# Patient Record
Sex: Female | Born: 2000
Health system: Southern US, Community
[De-identification: ages and names within clinical notes are randomized; demographics above are authoritative.]

## PROBLEM LIST (undated history)

## (undated) HISTORY — PX: WISDOM TOOTH EXTRACTION: SHX21

---
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
## 2020-01-10 ENCOUNTER — Ambulatory Visit
Admission: EM | Admit: 2020-01-10 | Discharge: 2020-01-10 | Disposition: A | Discharge status: Home / Self Care | Payer: 59 | Attending: Physician Assistant | Admitting: Physician Assistant

## 2020-01-10 DIAGNOSIS — R42 Dizziness and giddiness: Secondary | ICD-10-CM | POA: Diagnosis not present

## 2020-01-10 MED ORDER — SODIUM CHLORIDE 0.9 % IV BOLUS
1000.0000 mL | Freq: Once | INTRAVENOUS | Status: AC
  Administered 2020-01-10: 1000 mL via INTRAVENOUS

## 2020-01-10 MED ORDER — MECLIZINE HCL 25 MG PO TABS: 25.0000 mg | ORAL_TABLET | Freq: Three times a day (TID) | ORAL | 0 refills | Status: AC | PRN

## 2020-01-10 NOTE — ED Provider Notes (Signed)
[name]    CSN: [id number] Arrival date & time: 01/10/20  0932      History   Chief Complaint Chief Complaint  Patient presents with  . Dizziness    HPI Michelle Wright is a 19 y.o. female.   19 year old female comes in for sudden onset of dizziness this morning.  States was getting out of her truck to walk into school when she felt dizzy.  She describes it as a spinning sensation, worse with eye and head movement.  Denies head injury, loss of consciousness.  Denies URI symptoms such as cough, congestion, sore throat.  Denies fever, chills, body aches.  Denies shortness of breath, chest pain, palpitations.  Denies abdominal pain, nausea, vomiting.  Denies urinary symptoms such as frequency, dysuria, hematuria.  She recently returned from [place] via plane flight, denies one-sided leg swelling, oral birth control use.  States was in [place] for band, and sweated a lot more than normal. Has tried to hydrate with water. Denies being on an diet at this time. Breakfast consists of granola bar and water.   History of COVID 10/08/2019.     History reviewed. No pertinent past medical history.  There are no problems to display for this patient.   Past Surgical History:  Procedure Laterality Date  . WISDOM TOOTH EXTRACTION      OB History   No obstetric history on file.      Home Medications    Prior to Admission medications   Medication Sig Start Date End Date Taking? Authorizing Provider  meclizine (ANTIVERT) 25 MG tablet Take 1 tablet (25 mg total) by mouth 3 (three) times daily as needed for dizziness. 01/10/20   Ok Edwards, PA-C    Family History Family History  Problem Relation Age of Onset  . Diabetes Maternal Grandfather   . Heart disease Paternal Grandfather   . Stroke Paternal Grandfather     Social History Social History   Tobacco Use  . Smoking status: Never Smoker  . Smokeless tobacco: Never Used  Substance Use Topics  . Alcohol use: No   . Drug use: No     Allergies   Amoxicillin   Review of Systems Review of Systems  Reason unable to perform ROS: See HPI as above.     Physical Exam Triage Vital Signs ED Triage Vitals  Enc Vitals Group     BP 01/10/20 0946 127/87     Pulse Rate 01/10/20 0946 73     Resp 01/10/20 0946 16     Temp 01/10/20 0946 98.1 F (36.7 C)     Temp Source 01/10/20 0946 Oral     SpO2 01/10/20 0946 98 %     Weight --      Height --      Head Circumference --      Peak Flow --      Pain Score 01/10/20 0951 0     Pain Loc --      Pain Edu? --      Excl. in [place]? --    Orthostatic VS for the past 24 hrs:  BP- Lying Pulse- Lying BP- Sitting Pulse- Sitting BP- Standing at 0 minutes Pulse- Standing at 0 minutes  01/10/20 0956 129/86 76 128/87 91 120/87 100    Updated Vital Signs BP 127/87 (BP Location: Left Arm)   Pulse 73   Temp 98.1 F (36.7 C) (Oral)   Resp 16   LMP 12/20/2019   SpO2 98%  Physical Exam Constitutional:      General: She is not in acute distress.    Appearance: Normal appearance. She is not ill-appearing, toxic-appearing or diaphoretic.  HENT:     Head: Normocephalic and atraumatic.     Right Ear: Tympanic membrane, ear canal and external ear normal. Tympanic membrane is not erythematous or bulging.     Left Ear: Tympanic membrane, ear canal and external ear normal. Tympanic membrane is not erythematous or bulging.     Mouth/Throat:     Mouth: Mucous membranes are moist.     Pharynx: Oropharynx is clear. Uvula midline.  Eyes:     Extraocular Movements: Extraocular movements intact.     Right eye: No nystagmus.     Left eye: No nystagmus.     Conjunctiva/sclera: Conjunctivae normal.     Pupils: Pupils are equal, round, and reactive to light.  Cardiovascular:     Rate and Rhythm: Normal rate and regular rhythm.     Heart sounds: Normal heart sounds. No murmur. No friction rub. No gallop.   Pulmonary:     Effort: Pulmonary effort is normal. No accessory  muscle usage, prolonged expiration, respiratory distress or retractions.     Comments: Lungs clear to auscultation without adventitious lung sounds. Musculoskeletal:     Cervical back: Normal range of motion and neck supple. No pain with movement, spinous process tenderness or muscular tenderness.  Neurological:     General: No focal deficit present.     Mental Status: She is alert and oriented to person, place, and time.     GCS: GCS eye subscore is 4. GCS verbal subscore is 5. GCS motor subscore is 6.     Comments: Cranial nerves II-XII grossly intact. Strength 5/5 bilaterally for upper and lower extremity. Sensation intact. Normal coordination with normal finger to nose, heel to shin. Negative pronator drift, romberg. Gait intact. Able to ambulate on own without difficulty.       UC Treatments / Results  Labs (all labs ordered are listed, but only abnormal results are displayed) Labs Reviewed - No data to display  EKG   Radiology No results found.  Procedures Procedures (including critical care time)  Medications Ordered in UC Medications  sodium chloride 0.9 % bolus 1,000 mL (1,000 mLs Intravenous New Bag/Given 01/10/20 1027)    Initial Impression / Assessment and Plan / UC Course  I have reviewed the triage vital signs and the nursing notes.  Pertinent labs & imaging results that were available during my care of the patient were reviewed by me and considered in my medical decision making (see chart for details).    Patient symptomatic during orthostatics. Neurology exam grossly intact. Although without orthostatic hypotension, given symptomatic orthostatics, will provide IV fluids and reevaluate.   Patient with significantly improved dizziness after IV fluids, though still with intermittent dizziness.  At this time, will provide meclizine for other symptomatic treatment.  Discussed eating nutritious meals, avoiding only having carbs in the morning.  Discussed hydrating  throughout the day.  Patient to follow-up with PCP for further evaluation management if symptoms not improving.  Otherwise return precautions given.  Patient expresses understanding and agrees to plan.  With patient's permission, discussed current findings and treatment plan with parents, who agrees to plan.  Final Clinical Impressions(s) / UC Diagnoses   Final diagnoses:  Orthostatic dizziness   ED Prescriptions    Medication Sig Dispense Auth. Provider   meclizine (ANTIVERT) 25 MG tablet Take 1 tablet (25  mg total) by mouth 3 (three) times daily as needed for dizziness. 15 tablet Belinda Fisher, PA-C     PDMP not reviewed this encounter.   Belinda Fisher, PA-C 01/10/20 1120

## 2020-01-10 NOTE — ED Triage Notes (Signed)
Pt c/o dizziness after getting out of her truck to walk into school. States ate breakfast and still dizzy. [place]. Denies any URI sx's. States had COVID 11/20

## 2020-01-10 NOTE — Discharge Instructions (Signed)
As discussed, dizziness most likely from dehydration. However, given also with dizziness during head movement, and recently flew on plane, can try meclizine if needed to help with dizziness. Keep hydrated, urine should be clear to pale yellow in color. Make sure to eat healthy/nutritious food to prevent dizziness/dehydration. Follow up with PCP for further work up if continues with dizziness. Go to the emergency department if passing out, significantly worsening symptoms.

## 2020-03-15 ENCOUNTER — Encounter: Payer: Self-pay | Admitting: Emergency Medicine

## 2020-03-15 ENCOUNTER — Other Ambulatory Visit: Payer: Self-pay

## 2020-03-15 ENCOUNTER — Ambulatory Visit
Admission: EM | Admit: 2020-03-15 | Discharge: 2020-03-15 | Disposition: A | Discharge status: Home / Self Care | Payer: 59 | Attending: Physician Assistant | Admitting: Physician Assistant

## 2020-03-15 DIAGNOSIS — R112 Nausea with vomiting, unspecified: Secondary | ICD-10-CM | POA: Diagnosis not present

## 2020-03-15 MED ORDER — ONDANSETRON 4 MG PO TBDP: 4.0000 mg | ORAL_TABLET | Freq: Three times a day (TID) | ORAL | 0 refills | Status: AC | PRN

## 2020-03-15 MED ORDER — ONDANSETRON 4 MG PO TBDP
4.0000 mg | ORAL_TABLET | Freq: Once | ORAL | Status: AC
  Administered 2020-03-15: 09:00:00 4 mg via ORAL

## 2020-03-15 NOTE — ED Notes (Signed)
Reports dizziness with each position change during orthostatic VS check.

## 2020-03-15 NOTE — ED Provider Notes (Signed)
[name]    CSN: [id number] Arrival date & time: 03/15/20  7096      History   Chief Complaint Chief Complaint  Patient presents with  . Abdominal Pain  . Dizziness  . Nausea    HPI Michelle Wright is a 19 y.o. female.   19 year old female comes in for 2-day history of nausea, vomiting.  Has had 3 episodes of nonbilious nonbloody vomit.  Has low abdominal cramping/throbbing that is constant, not associated with oral intake, vomiting.  Denies diarrhea.  Has had dizziness/lightheadedness for the past 2 days as well.  Denies fever, chills, body aches.  Denies URI symptoms such as cough, congestion, sore throat.  Denies urinary symptoms frequency, dysuria, hematuria.  LMP 02/16/2020, denies possibility of pregnancy.     History reviewed. No pertinent past medical history.  There are no problems to display for this patient.   Past Surgical History:  Procedure Laterality Date  . WISDOM TOOTH EXTRACTION      OB History   No obstetric history on file.      Home Medications    Prior to Admission medications   Medication Sig Start Date End Date Taking? Authorizing Provider  meclizine (ANTIVERT) 25 MG tablet Take 1 tablet (25 mg total) by mouth 3 (three) times daily as needed for dizziness. 01/10/20   Tasia Catchings, Chisom Muntean V, PA-C  ondansetron (ZOFRAN ODT) 4 MG disintegrating tablet Take 1 tablet (4 mg total) by mouth every 8 (eight) hours as needed for nausea or vomiting. 03/15/20   Ok Edwards, PA-C    Family History Family History  Problem Relation Age of Onset  . Diabetes Maternal Grandfather   . Heart disease Paternal Grandfather   . Stroke Paternal Grandfather     Social History Social History   Tobacco Use  . Smoking status: Never Smoker  . Smokeless tobacco: Never Used  Substance Use Topics  . Alcohol use: No  . Drug use: No     Allergies   Amoxicillin   Review of Systems Review of Systems  Reason unable to perform ROS: See HPI as above.      Physical Exam Triage Vital Signs ED Triage Vitals  Enc Vitals Group     BP 03/15/20 0840 121/83     Pulse Rate 03/15/20 0840 92     Resp 03/15/20 0840 16     Temp 03/15/20 0840 98.3 F (36.8 C)     Temp Source 03/15/20 0840 Oral     SpO2 03/15/20 0840 96 %     Weight --      Height --      Head Circumference --      Peak Flow --      Pain Score 03/15/20 0838 2     Pain Loc --      Pain Edu? --      Excl. in [place]? --    Orthostatic VS for the past 24 hrs:  BP- Lying Pulse- Lying BP- Sitting Pulse- Sitting BP- Standing at 0 minutes Pulse- Standing at 0 minutes  03/15/20 0843 121/83 91 123/81 97 121/86 106    Updated Vital Signs BP 121/83   Pulse 92   Temp 98.3 F (36.8 C) (Oral)   Resp 16   LMP 02/16/2020   SpO2 96%   Physical Exam Constitutional:      General: She is not in acute distress.    Appearance: She is well-developed. She is not ill-appearing, toxic-appearing or diaphoretic.  HENT:     Head: Normocephalic and atraumatic.  Eyes:     Conjunctiva/sclera: Conjunctivae normal.     Pupils: Pupils are equal, round, and reactive to light.  Cardiovascular:     Rate and Rhythm: Normal rate and regular rhythm.  Pulmonary:     Effort: Pulmonary effort is normal. No respiratory distress.     Comments: LCTAB Abdominal:     General: Bowel sounds are normal.     Palpations: Abdomen is soft.     Tenderness: There is no abdominal tenderness. There is no right CVA tenderness, left CVA tenderness, guarding or rebound.  Musculoskeletal:     Cervical back: Normal range of motion and neck supple.  Skin:    General: Skin is warm and dry.  Neurological:     Mental Status: She is alert and oriented to person, place, and time.  Psychiatric:        Behavior: Behavior normal.        Judgment: Judgment normal.    UC Treatments / Results  Labs (all labs ordered are listed, but only abnormal results are displayed) Labs Reviewed - No data to  display  EKG   Radiology No results found.  Procedures Procedures (including critical care time)  Medications Ordered in UC Medications  ondansetron (ZOFRAN-ODT) disintegrating tablet 4 mg (4 mg Oral Given 03/15/20 0916)    Initial Impression / Assessment and Plan / UC Course  I have reviewed the triage vital signs and the nursing notes.  Pertinent labs & imaging results that were available during my care of the patient were reviewed by me and considered in my medical decision making (see chart for details).    No alarming signs on exam. Abdomen soft, + BS, nontender to palpation.  Negative orthostatics.  Patient with history of dizziness/lightheadedness due to dehydration, will have patient hydrate well at this time.  Symptomatic management discussed.  Return precautions given.  Patient expresses understanding and agrees to plan.  Final Clinical Impressions(s) / UC Diagnoses   Final diagnoses:  Intractable vomiting with nausea, unspecified vomiting type   ED Prescriptions    Medication Sig Dispense Auth. Provider   ondansetron (ZOFRAN ODT) 4 MG disintegrating tablet Take 1 tablet (4 mg total) by mouth every 8 (eight) hours as needed for nausea or vomiting. 20 tablet Belinda Fisher, PA-C     PDMP not reviewed this encounter.   Belinda Fisher, PA-C 03/15/20 [phone]

## 2020-03-15 NOTE — Discharge Instructions (Signed)
Zofran for nausea and vomiting as needed. Keep hydrated, you urine should be clear to pale yellow in color. Bland diet, advance as tolerated. Monitor for any worsening of symptoms, nausea or vomiting not controlled by medication, worsening abdominal pain, fever, go to the emergency department for further evaluation needed.  ° °

## 2020-03-15 NOTE — ED Triage Notes (Signed)
PT reports nausea for 2 days, she vomited 3 times last night. Dizziness / lightheadedness for 2 days as well, no LOC. Lower abdominal pain last night.

## 2021-01-23 IMAGING — DX DG CHEST 1V PORT
1 series · 1 of 1 positions shown · non-contrast
Comparison: None.

CLINICAL DATA: Shortness of breath, dizziness, dehydration.

EXAM:
PORTABLE CHEST 1 VIEW

[chest ap]
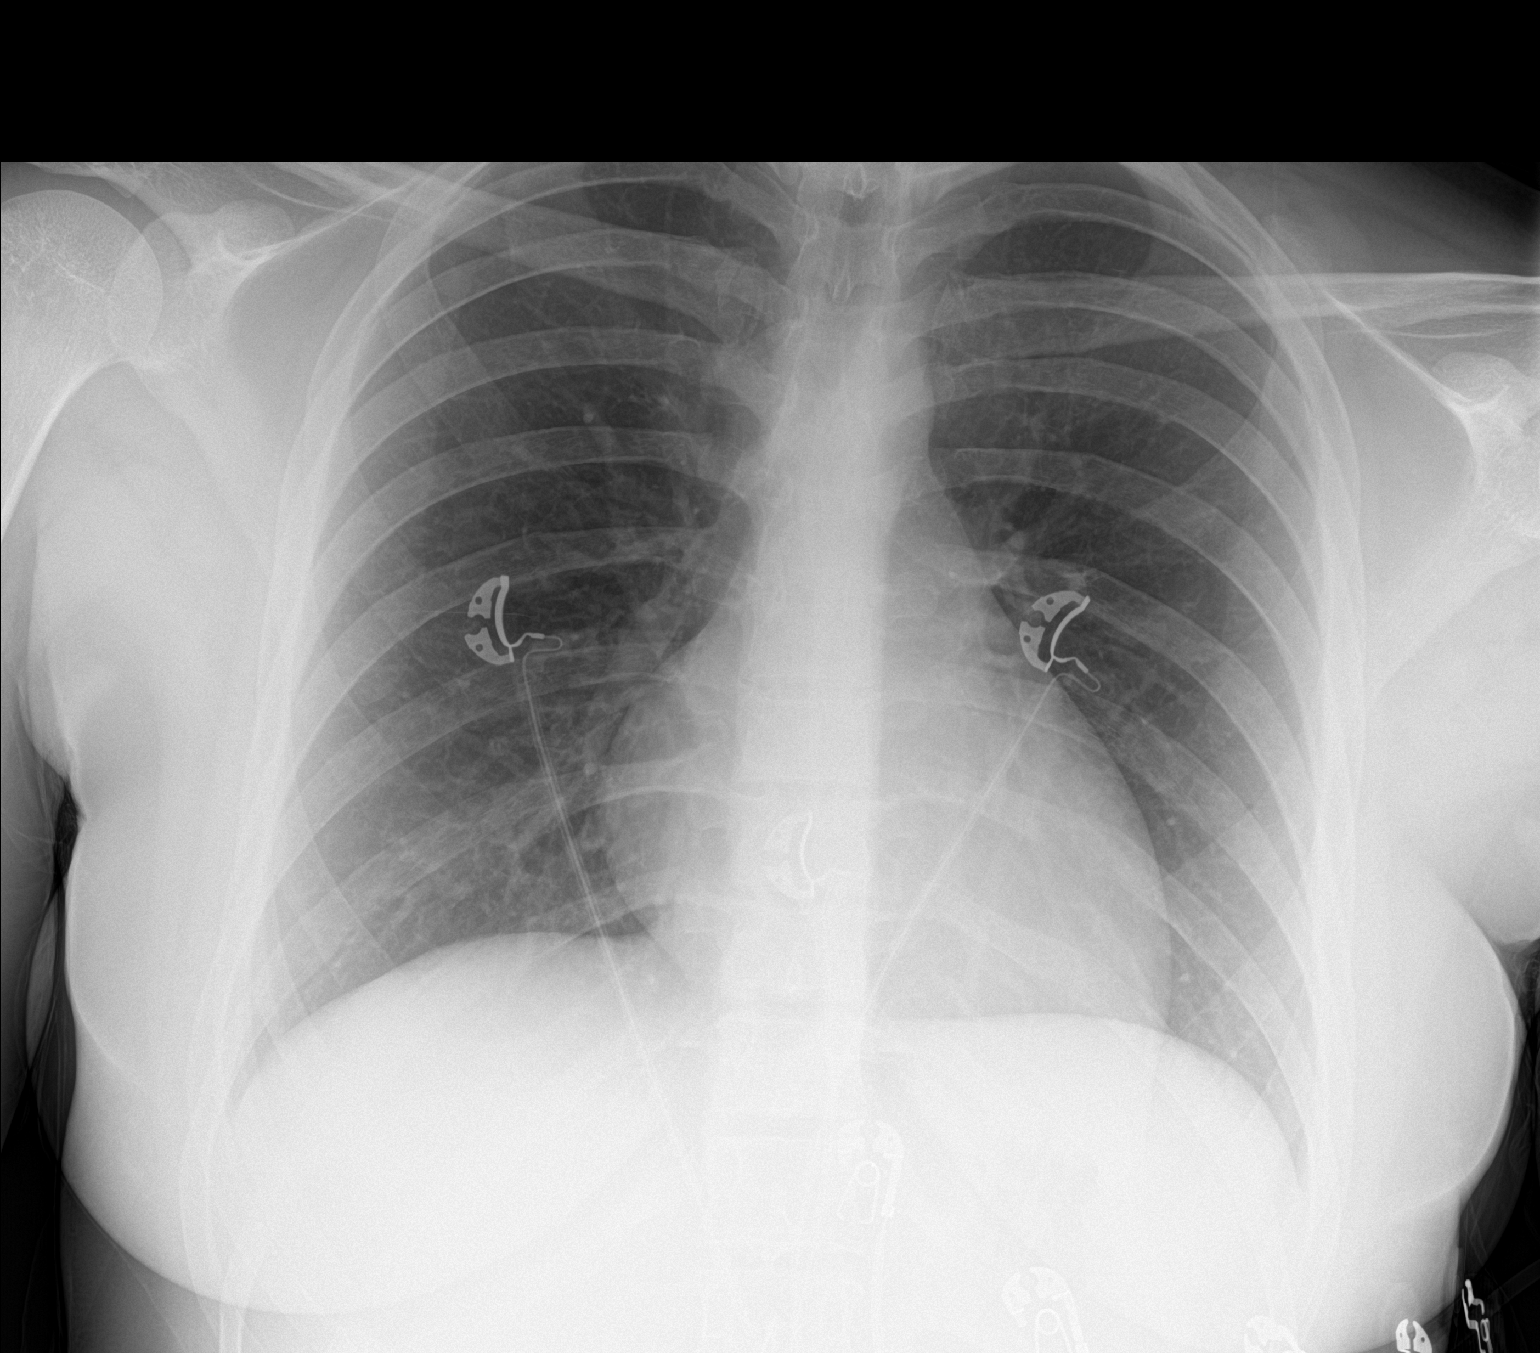

[1 of 1 positions shown; findings below may reference images not displayed]

FINDINGS: The heart size and mediastinal contours are within normal limits.
Both lungs are clear. The visualized skeletal structures are
unremarkable.
IMPRESSION: Normal chest x-ray.

## 2021-09-07 ENCOUNTER — Encounter: Payer: Self-pay | Admitting: *Deleted

## 2021-09-07 ENCOUNTER — Ambulatory Visit
Admission: EM | Admit: 2021-09-07 | Discharge: 2021-09-07 | Disposition: A | Discharge status: Home / Self Care | Payer: 59 | Attending: Physician Assistant | Admitting: Physician Assistant

## 2021-09-07 ENCOUNTER — Other Ambulatory Visit: Payer: Self-pay

## 2021-09-07 DIAGNOSIS — J069 Acute upper respiratory infection, unspecified: Secondary | ICD-10-CM

## 2021-09-07 MED ORDER — PSEUDOEPH-BROMPHEN-DM 30-2-10 MG/5ML PO SYRP: 5.0000 mL | ORAL_SOLUTION | Freq: Four times a day (QID) | ORAL | 0 refills | Status: AC | PRN

## 2021-09-07 NOTE — ED Provider Notes (Signed)
[name]    CSN: [id number] Arrival date & time: 09/07/21  3818      History   Chief Complaint Chief Complaint  Patient presents with   Cough    HPI Michelle Wright is a 20 y.o. female.   Patient here today for evaluation of nasal congestion and drainage, cough and sore throat that started about 6 days ago.  She has not had fever in the last few days but did initially have fever.  She has been seen by student health and was screened for COVID and flu which were both negative.  She does not report any vomiting or diarrhea.  She has tried over-the-counter medications without significant relief.  The history is provided by the patient.   History reviewed. No pertinent past medical history.  There are no problems to display for this patient.   Past Surgical History:  Procedure Laterality Date   WISDOM TOOTH EXTRACTION      OB History   No obstetric history on file.      Home Medications    Prior to Admission medications   Medication Sig Start Date End Date Taking? Authorizing Provider  brompheniramine-pseudoephedrine-DM 30-2-10 MG/5ML syrup Take 5 mLs by mouth 4 (four) times daily as needed for up to 7 days. 09/07/21 09/14/21 Yes Tomi Bamberger, PA-C  meclizine (ANTIVERT) 25 MG tablet Take 1 tablet (25 mg total) by mouth 3 (three) times daily as needed for dizziness. 01/10/20   Cathie Hoops, Amy V, PA-C  ondansetron (ZOFRAN ODT) 4 MG disintegrating tablet Take 1 tablet (4 mg total) by mouth every 8 (eight) hours as needed for nausea or vomiting. 03/15/20   Belinda Fisher, PA-C    Family History Family History  Problem Relation Age of Onset   Healthy Mother    Healthy Father    Diabetes Maternal Grandfather    Heart disease Paternal Grandfather    Stroke Paternal Grandfather     Social History Social History   Tobacco Use   Smoking status: Never   Smokeless tobacco: Never  Vaping Use   Vaping Use: Never used  Substance Use Topics   Alcohol use: No   Drug  use: No     Allergies   Amoxicillin   Review of Systems Review of Systems  Constitutional:  Negative for chills and fever.  HENT:  Positive for congestion, sinus pressure and sore throat. Negative for ear pain.   Eyes:  Negative for discharge and redness.  Respiratory:  Positive for cough. Negative for shortness of breath and wheezing.   Gastrointestinal:  Negative for abdominal pain, diarrhea, nausea and vomiting.    Physical Exam Triage Vital Signs ED Triage Vitals  Enc Vitals Group     BP      Pulse      Resp      Temp      Temp src      SpO2      Weight      Height      Head Circumference      Peak Flow      Pain Score      Pain Loc      Pain Edu?      Excl. in [place]?    No data found.  Updated Vital Signs BP 127/88   Pulse 95   Temp 98.6 F (37 C) (Temporal)   Resp 16   LMP 08/12/2021 (Approximate)   SpO2 95%      Physical Exam  Vitals and nursing note reviewed.  Constitutional:      General: She is not in acute distress.    Appearance: Normal appearance. She is not ill-appearing.  HENT:     Head: Normocephalic and atraumatic.     Right Ear: Tympanic membrane normal.     Left Ear: Tympanic membrane normal.     Nose: Congestion present.     Mouth/Throat:     Mouth: Mucous membranes are moist.     Pharynx: No oropharyngeal exudate or posterior oropharyngeal erythema.  Eyes:     Conjunctiva/sclera: Conjunctivae normal.  Cardiovascular:     Rate and Rhythm: Normal rate and regular rhythm.     Heart sounds: Normal heart sounds. No murmur heard. Pulmonary:     Effort: Pulmonary effort is normal. No respiratory distress.     Breath sounds: Normal breath sounds. No wheezing, rhonchi or rales.  Skin:    General: Skin is warm and dry.  Neurological:     Mental Status: She is alert.  Psychiatric:        Mood and Affect: Mood normal.        Thought Content: Thought content normal.     UC Treatments / Results  Labs (all labs ordered are listed, but  only abnormal results are displayed) Labs Reviewed - No data to display  EKG   Radiology No results found.  Procedures Procedures (including critical care time)  Medications Ordered in UC Medications - No data to display  Initial Impression / Assessment and Plan / UC Course  I have reviewed the triage vital signs and the nursing notes.  Pertinent labs & imaging results that were available during my care of the patient were reviewed by me and considered in my medical decision making (see chart for details).  Suspect likely viral etiology of symptoms and cough syrup prescribed for same.  Recommended follow-up if symptoms do not improve over the next 4 to 5 days or sooner with any worsening.  Final Clinical Impressions(s) / UC Diagnoses   Final diagnoses:  Acute upper respiratory infection   Discharge Instructions   None    ED Prescriptions     Medication Sig Dispense Auth. Provider   brompheniramine-pseudoephedrine-DM 30-2-10 MG/5ML syrup Take 5 mLs by mouth 4 (four) times daily as needed for up to 7 days. 120 mL Tomi Bamberger, PA-C      PDMP not reviewed this encounter.   Tomi Bamberger, PA-C 09/07/21 [phone]

## 2021-09-07 NOTE — ED Triage Notes (Signed)
C/O cough, runny nose, nasal congestion onset 6 days ago.  Reports tactile fever 4 days ago, which pt states has resolved.  [facility], had negative Covid & influenza.

## 2021-11-02 ENCOUNTER — Encounter (HOSPITAL_COMMUNITY)
Admission: EM | Disposition: A | Discharge status: Home / Self Care | Payer: Self-pay | Source: Home / Self Care | Attending: Emergency Medicine

## 2021-11-02 ENCOUNTER — Other Ambulatory Visit: Payer: Self-pay

## 2021-11-02 ENCOUNTER — Encounter (HOSPITAL_COMMUNITY): Payer: Self-pay | Admitting: Emergency Medicine

## 2021-11-02 ENCOUNTER — Emergency Department (HOSPITAL_COMMUNITY): Payer: 59

## 2021-11-02 ENCOUNTER — Emergency Department (HOSPITAL_COMMUNITY): Payer: 59 | Admitting: Certified Registered"

## 2021-11-02 ENCOUNTER — Ambulatory Visit (HOSPITAL_COMMUNITY)
Admission: EM | Admit: 2021-11-02 | Discharge: 2021-11-02 | Disposition: A | Discharge status: Home / Self Care | Payer: 59 | Attending: Emergency Medicine | Admitting: Emergency Medicine

## 2021-11-02 DIAGNOSIS — K353 Acute appendicitis with localized peritonitis, without perforation or gangrene: Secondary | ICD-10-CM | POA: Insufficient documentation

## 2021-11-02 DIAGNOSIS — R109 Unspecified abdominal pain: Secondary | ICD-10-CM

## 2021-11-02 DIAGNOSIS — Z20822 Contact with and (suspected) exposure to covid-19: Secondary | ICD-10-CM | POA: Diagnosis not present

## 2021-11-02 HISTORY — PX: LAPAROSCOPIC APPENDECTOMY: SHX408

## 2021-11-02 LAB — COMPREHENSIVE METABOLIC PANEL
ALT: 23 U/L (ref 0–44)
AST: 25 U/L (ref 15–41)
Albumin: 4.4 g/dL (ref 3.5–5.0)
Alkaline Phosphatase: 63 U/L (ref 38–126)
Anion gap: 9 (ref 5–15)
BUN: 10 mg/dL (ref 6–20)
CO2: 25 mmol/L (ref 22–32)
Calcium: 9.3 mg/dL (ref 8.9–10.3)
Chloride: 102 mmol/L (ref 98–111)
Creatinine, Ser: 0.62 mg/dL (ref 0.44–1.00)
GFR, Estimated: >60 mL/min (ref >60)
Glucose, Bld: 98 mg/dL (ref 70–99)
Potassium: 3.4 mmol/L — ABNORMAL LOW (ref 3.5–5.1)
Sodium: 136 mmol/L (ref 135–145)
Total Bilirubin: 0.8 mg/dL (ref 0.3–1.2)
Total Protein: 7.2 g/dL (ref 6.5–8.1)

## 2021-11-02 LAB — CBC WITH DIFFERENTIAL/PLATELET
Abs Immature Granulocytes: 0.06 10*3/uL (ref 0.00–0.07)
Basophils Absolute: 0 10*3/uL (ref 0.0–0.1)
Basophils Relative: 0 %
Eosinophils Absolute: 0.1 10*3/uL (ref 0.0–0.5)
Eosinophils Relative: 1 %
HCT: 40.6 % (ref 36.0–46.0)
Hemoglobin: 14.5 g/dL (ref 12.0–15.0)
Immature Granulocytes: 0 %
Lymphocytes Relative: 15 %
Lymphs Abs: 2.4 10*3/uL (ref 0.7–4.0)
MCH: 30.6 pg (ref 26.0–34.0)
MCHC: 35.7 g/dL (ref 30.0–36.0)
MCV: 85.7 fL (ref 80.0–100.0)
Monocytes Absolute: 1.4 10*3/uL — ABNORMAL HIGH (ref 0.1–1.0)
Monocytes Relative: 8 %
Neutro Abs: 12.6 10*3/uL — ABNORMAL HIGH (ref 1.7–7.7)
Neutrophils Relative %: 76 %
Platelets: 232 10*3/uL (ref 150–400)
RBC: 4.74 MIL/uL (ref 3.87–5.11)
RDW: 12 % (ref 11.5–15.5)
WBC: 16.5 10*3/uL — ABNORMAL HIGH (ref 4.0–10.5)
nRBC: 0 % (ref 0.0–0.2)

## 2021-11-02 LAB — I-STAT BETA HCG BLOOD, ED (MC, WL, AP ONLY): I-stat hCG, quantitative: <5 m[IU]/mL (ref <5)

## 2021-11-02 LAB — RESP PANEL BY RT-PCR (FLU A&B, COVID) ARPGX2
Influenza A by PCR: NEGATIVE
Influenza B by PCR: NEGATIVE
SARS Coronavirus 2 by RT PCR: NEGATIVE

## 2021-11-02 LAB — URINALYSIS, ROUTINE W REFLEX MICROSCOPIC
Bilirubin Urine: NEGATIVE
Glucose, UA: NEGATIVE mg/dL
Hgb urine dipstick: NEGATIVE
Ketones, ur: NEGATIVE mg/dL
Leukocytes,Ua: NEGATIVE
Nitrite: NEGATIVE
Protein, ur: NEGATIVE mg/dL
Specific Gravity, Urine: <1.005 — ABNORMAL LOW (ref 1.005–1.030)
pH: 7 (ref 5.0–8.0)

## 2021-11-02 LAB — LIPASE, BLOOD: Lipase: 35 U/L (ref 11–51)

## 2021-11-02 SURGERY — APPENDECTOMY, LAPAROSCOPIC
Anesthesia: General

## 2021-11-02 MED ORDER — SUCCINYLCHOLINE CHLORIDE 200 MG/10ML IV SOSY
PREFILLED_SYRINGE | INTRAVENOUS | Status: DC | PRN
  Administered 2021-11-02: 140 mg via INTRAVENOUS

## 2021-11-02 MED ORDER — OXYCODONE HCL 5 MG PO TABS
5.0000 mg | ORAL_TABLET | Freq: Once | ORAL | Status: AC | PRN
  Administered 2021-11-02: 19:00:00 5 mg via ORAL

## 2021-11-02 MED ORDER — SUGAMMADEX SODIUM 200 MG/2ML IV SOLN
INTRAVENOUS | Status: DC | PRN
  Administered 2021-11-02: 200 mg via INTRAVENOUS

## 2021-11-02 MED ORDER — MIDAZOLAM HCL 5 MG/5ML IJ SOLN
INTRAMUSCULAR | Status: DC | PRN
  Administered 2021-11-02: 2 mg via INTRAVENOUS

## 2021-11-02 MED ORDER — ACETAMINOPHEN 500 MG PO TABS
1000.0000 mg | ORAL_TABLET | ORAL | Status: AC
  Administered 2021-11-02: 17:00:00 1000 mg via ORAL
  Filled 2021-11-02: qty 2

## 2021-11-02 MED ORDER — ONDANSETRON HCL 4 MG/2ML IJ SOLN
INTRAMUSCULAR | Status: DC | PRN
  Administered 2021-11-02: 4 mg via INTRAVENOUS

## 2021-11-02 MED ORDER — FENTANYL CITRATE (PF) 250 MCG/5ML IJ SOLN
INTRAMUSCULAR | Status: AC
  Filled 2021-11-02: qty 5

## 2021-11-02 MED ORDER — BUPIVACAINE-EPINEPHRINE 0.25% -1:200000 IJ SOLN
INTRAMUSCULAR | Status: DC | PRN
  Administered 2021-11-02: 30 mL

## 2021-11-02 MED ORDER — METRONIDAZOLE 500 MG/100ML IV SOLN
500.0000 mg | Freq: Once | INTRAVENOUS | Status: AC
  Administered 2021-11-02: 15:00:00 500 mg via INTRAVENOUS
  Filled 2021-11-02: qty 100

## 2021-11-02 MED ORDER — LACTATED RINGERS IV SOLN: INTRAVENOUS | Status: DC

## 2021-11-02 MED ORDER — IBUPROFEN 200 MG PO TABS: 400.0000 mg | ORAL_TABLET | Freq: Four times a day (QID) | ORAL | 0 refills | Status: AC | PRN

## 2021-11-02 MED ORDER — HYDROMORPHONE HCL 1 MG/ML IJ SOLN
INTRAMUSCULAR | Status: AC
  Filled 2021-11-02: qty 1

## 2021-11-02 MED ORDER — ROCURONIUM BROMIDE 10 MG/ML (PF) SYRINGE
PREFILLED_SYRINGE | INTRAVENOUS | Status: DC | PRN
  Administered 2021-11-02: 30 mg via INTRAVENOUS

## 2021-11-02 MED ORDER — SODIUM CHLORIDE 0.9 % IR SOLN
Status: DC | PRN
  Administered 2021-11-02: 1000 mL

## 2021-11-02 MED ORDER — PROPOFOL 10 MG/ML IV BOLUS
INTRAVENOUS | Status: AC
  Filled 2021-11-02: qty 20

## 2021-11-02 MED ORDER — 0.9 % SODIUM CHLORIDE (POUR BTL) OPTIME
TOPICAL | Status: DC | PRN
  Administered 2021-11-02: 17:00:00 1000 mL

## 2021-11-02 MED ORDER — LIDOCAINE 2% (20 MG/ML) 5 ML SYRINGE
INTRAMUSCULAR | Status: DC | PRN
  Administered 2021-11-02: 100 mg via INTRAVENOUS

## 2021-11-02 MED ORDER — ONDANSETRON HCL 4 MG/2ML IJ SOLN: 4.0000 mg | Freq: Once | INTRAMUSCULAR | Status: DC | PRN

## 2021-11-02 MED ORDER — IOHEXOL 300 MG/ML  SOLN
100.0000 mL | Freq: Once | INTRAMUSCULAR | Status: AC | PRN
  Administered 2021-11-02: 14:00:00 100 mL via INTRAVENOUS

## 2021-11-02 MED ORDER — PROPOFOL 10 MG/ML IV BOLUS
INTRAVENOUS | Status: DC | PRN
  Administered 2021-11-02: 200 mg via INTRAVENOUS

## 2021-11-02 MED ORDER — HYDROMORPHONE HCL 1 MG/ML IJ SOLN
1.0000 mg | Freq: Once | INTRAMUSCULAR | Status: AC
  Administered 2021-11-02: 13:00:00 1 mg via INTRAVENOUS
  Filled 2021-11-02: qty 1

## 2021-11-02 MED ORDER — CIPROFLOXACIN IN D5W 400 MG/200ML IV SOLN
400.0000 mg | Freq: Once | INTRAVENOUS | Status: AC
  Administered 2021-11-02: 16:00:00 400 mg via INTRAVENOUS
  Filled 2021-11-02: qty 200

## 2021-11-02 MED ORDER — KETOROLAC TROMETHAMINE 30 MG/ML IJ SOLN
30.0000 mg | Freq: Once | INTRAMUSCULAR | Status: AC
  Administered 2021-11-02: 13:00:00 30 mg via INTRAVENOUS
  Filled 2021-11-02: qty 1

## 2021-11-02 MED ORDER — HYDROMORPHONE HCL 1 MG/ML IJ SOLN
0.2500 mg | INTRAMUSCULAR | Status: DC | PRN
  Administered 2021-11-02 (×2): 0.5 mg via INTRAVENOUS

## 2021-11-02 MED ORDER — DEXAMETHASONE SODIUM PHOSPHATE 10 MG/ML IJ SOLN
INTRAMUSCULAR | Status: DC | PRN
  Administered 2021-11-02: 10 mg via INTRAVENOUS

## 2021-11-02 MED ORDER — CHLORHEXIDINE GLUCONATE 0.12 % MT SOLN: 15.0000 mL | Freq: Once | OROMUCOSAL | Status: AC

## 2021-11-02 MED ORDER — ONDANSETRON HCL 4 MG/2ML IJ SOLN
4.0000 mg | Freq: Once | INTRAMUSCULAR | Status: AC
  Administered 2021-11-02: 13:00:00 4 mg via INTRAVENOUS
  Filled 2021-11-02: qty 2

## 2021-11-02 MED ORDER — CHLORHEXIDINE GLUCONATE CLOTH 2 % EX PADS: 6.0000 | MEDICATED_PAD | Freq: Once | CUTANEOUS | Status: DC

## 2021-11-02 MED ORDER — CHLORHEXIDINE GLUCONATE 0.12 % MT SOLN
OROMUCOSAL | Status: AC
  Administered 2021-11-02: 16:00:00 15 mL via OROMUCOSAL
  Filled 2021-11-02: qty 15

## 2021-11-02 MED ORDER — FENTANYL CITRATE (PF) 250 MCG/5ML IJ SOLN
INTRAMUSCULAR | Status: DC | PRN
  Administered 2021-11-02 (×3): 50 ug via INTRAVENOUS
  Administered 2021-11-02: 100 ug via INTRAVENOUS

## 2021-11-02 MED ORDER — OXYCODONE HCL 5 MG/5ML PO SOLN: 5.0000 mg | Freq: Once | ORAL | Status: AC | PRN

## 2021-11-02 MED ORDER — SODIUM CHLORIDE 0.9 % IV SOLN: INTRAVENOUS | Status: DC | PRN

## 2021-11-02 MED ORDER — MIDAZOLAM HCL 2 MG/2ML IJ SOLN
INTRAMUSCULAR | Status: AC
  Filled 2021-11-02: qty 2

## 2021-11-02 MED ORDER — BUPIVACAINE-EPINEPHRINE (PF) 0.25% -1:200000 IJ SOLN
INTRAMUSCULAR | Status: AC
  Filled 2021-11-02: qty 30

## 2021-11-02 MED ORDER — HYDROCODONE-ACETAMINOPHEN 5-325 MG PO TABS: 1.0000 | ORAL_TABLET | Freq: Four times a day (QID) | ORAL | 0 refills | Status: AC | PRN

## 2021-11-02 MED ORDER — OXYCODONE HCL 5 MG PO TABS
ORAL_TABLET | ORAL | Status: AC
  Filled 2021-11-02: qty 1

## 2021-11-02 SURGICAL SUPPLY — 38 items
ADH SKN CLS APL DERMABOND .7 (GAUZE/BANDAGES/DRESSINGS) ×1
APL PRP STRL LF DISP 70% ISPRP (MISCELLANEOUS) ×1
BAG COUNTER SPONGE SURGICOUNT (BAG) ×2 IMPLANT
BAG SPEC RTRVL LRG 6X4 10 (ENDOMECHANICALS) ×1
BAG SPNG CNTER NS LX DISP (BAG) ×1
BAG SURGICOUNT SPONGE COUNTING (BAG) ×1
BLADE CLIPPER SURG (BLADE) ×2 IMPLANT
CANISTER SUCT 3000ML PPV (MISCELLANEOUS) ×3 IMPLANT
CHLORAPREP W/TINT 26 (MISCELLANEOUS) ×3 IMPLANT
COVER SURGICAL LIGHT HANDLE (MISCELLANEOUS) ×3 IMPLANT
CUTTER FLEX LINEAR 45M (STAPLE) ×2 IMPLANT
DERMABOND ADVANCED (GAUZE/BANDAGES/DRESSINGS) ×2
DERMABOND ADVANCED .7 DNX12 (GAUZE/BANDAGES/DRESSINGS) ×1 IMPLANT
ELECT REM PT RETURN 9FT ADLT (ELECTROSURGICAL) ×3
ELECTRODE REM PT RTRN 9FT ADLT (ELECTROSURGICAL) ×1 IMPLANT
GLOVE SURG POLY MICRO LF SZ5.5 (GLOVE) ×3 IMPLANT
GLOVE SURG UNDER POLY LF SZ6 (GLOVE) ×3 IMPLANT
GOWN STRL REUS W/ TWL LRG LVL3 (GOWN DISPOSABLE) ×3 IMPLANT
GOWN STRL REUS W/TWL LRG LVL3 (GOWN DISPOSABLE) ×9
KIT BASIN OR (CUSTOM PROCEDURE TRAY) ×3 IMPLANT
KIT TURNOVER KIT B (KITS) ×3 IMPLANT
NS IRRIG 1000ML POUR BTL (IV SOLUTION) ×3 IMPLANT
PAD ARMBOARD 7.5X6 YLW CONV (MISCELLANEOUS) ×6 IMPLANT
PENCIL BUTTON HOLSTER BLD 10FT (ELECTRODE) ×3 IMPLANT
POUCH SPECIMEN RETRIEVAL 10MM (ENDOMECHANICALS) ×3 IMPLANT
RELOAD STAPLE 45 3.5 BLU ETS (ENDOMECHANICALS) IMPLANT
RELOAD STAPLE TA45 3.5 REG BLU (ENDOMECHANICALS) ×3 IMPLANT
SET IRRIG TUBING LAPAROSCOPIC (IRRIGATION / IRRIGATOR) ×3 IMPLANT
SET TUBE SMOKE EVAC HIGH FLOW (TUBING) ×3 IMPLANT
SHEARS HARMONIC ACE PLUS 36CM (ENDOMECHANICALS) ×2 IMPLANT
SLEEVE ENDOPATH XCEL 5M (ENDOMECHANICALS) ×3 IMPLANT
SPECIMEN JAR SMALL (MISCELLANEOUS) ×3 IMPLANT
SUT MNCRL AB 4-0 PS2 18 (SUTURE) ×3 IMPLANT
TOWEL GREEN STERILE (TOWEL DISPOSABLE) ×3 IMPLANT
TOWEL GREEN STERILE FF (TOWEL DISPOSABLE) ×3 IMPLANT
TRAY LAPAROSCOPIC MC (CUSTOM PROCEDURE TRAY) ×3 IMPLANT
TROCAR XCEL BLUNT TIP 100MML (ENDOMECHANICALS) ×3 IMPLANT
TROCAR XCEL NON-BLD 5MMX100MML (ENDOMECHANICALS) ×3 IMPLANT

## 2021-11-02 NOTE — Discharge Instructions (Signed)
[facility] DISCHARGE INSTRUCTIONS  Activity No heavy lifting greater than 15 pounds for 4 weeks after surgery. Ok to shower in 24 hours, but do not bathe or submerge incisions underwater. Do not drive while taking narcotic pain medication.  Wound Care Your incisions are covered with skin glue called Dermabond. This will peel off on its own over time. You may shower in 24 hours and allow warm soapy water to run over your incisions. Gently pat dry. Do not submerge your incision underwater. Monitor your incision for any new redness, tenderness, or drainage.  When to Call us: Fever greater than 100.5 New redness, drainage, or swelling at incision site Severe pain, nausea, or vomiting  Follow-up You will have a follow up appointment in 2-3 weeks. This will be at the [facility] office at [address], [place], [place]. Please arrive 30 minutes prior to your scheduled appointment time to complete paperwork.  For questions or concerns, please call the office at [phone].

## 2021-11-02 NOTE — Transfer of Care (Signed)
Immediate Anesthesia Transfer of Care Note  Patient: Michelle Wright  Procedure(s) Performed: APPENDECTOMY LAPAROSCOPIC  Patient Location: PACU  Anesthesia Type:General  Level of Consciousness: awake, alert  and oriented  Airway & Oxygen Therapy: Patient Spontanous Breathing  Post-op Assessment: Report given to RN and Post -op Vital signs reviewed and stable  Post vital signs: Reviewed and stable  Last Vitals:  Vitals Value Taken Time  BP 130/77 11/02/21 1758  Temp    Pulse 93 11/02/21 1804  Resp 14 11/02/21 1804  SpO2 100 % 11/02/21 1804  Vitals shown include unvalidated device data.  Last Pain:  Vitals:   11/02/21 1626  TempSrc: Oral  PainSc: 0-No pain         Complications: No notable events documented.

## 2021-11-02 NOTE — Progress Notes (Signed)

## 2021-11-02 NOTE — ED Provider Notes (Signed)
[facility] EMERGENCY DEPARTMENT Provider Note   CSN: [id number] Arrival date & time: 11/02/21  1137     History Chief Complaint  Patient presents with   Abdominal Pain    Michelle Wright is a 20 y.o. female presented emerged department with abdominal pain.  Patient reports that she woke up with sharp pain in her left flank and left lower abdomen.  She says she has never had this pain before.  It is currently 10 out of 10 maximum intensity.  Nothing makes it better or for worse.  It seems to radiate down towards her lower abdomen she was nauseated and vomited this morning.  She does feel that she has been constipated or backed up for several days.  This is a chronic issue for her.  She denies any history of abdominal surgery.  She denies any vaginal bleeding or discharge.  She is not on her menses.  She is here with her mother at the bedside.  She is allergies to amoxicillin and Augmentin.  HPI     History reviewed. No pertinent past medical history.  There are no problems to display for this patient.   Past Surgical History:  Procedure Laterality Date   WISDOM TOOTH EXTRACTION       OB History   No obstetric history on file.     Family History  Problem Relation Age of Onset   Healthy Mother    Healthy Father    Diabetes Maternal Grandfather    Heart disease Paternal Grandfather    Stroke Paternal Grandfather     Social History   Tobacco Use   Smoking status: Never   Smokeless tobacco: Never  Vaping Use   Vaping Use: Never used  Substance Use Topics   Alcohol use: No   Drug use: No    Home Medications Prior to Admission medications   Medication Sig Start Date End Date Taking? Authorizing Provider  meclizine (ANTIVERT) 25 MG tablet Take 1 tablet (25 mg total) by mouth 3 (three) times daily as needed for dizziness. 01/10/20   Tasia Catchings, Amy V, PA-C  ondansetron (ZOFRAN ODT) 4 MG disintegrating tablet Take 1 tablet (4 mg total) by mouth every 8  (eight) hours as needed for nausea or vomiting. 03/15/20   Tasia Catchings, Amy V, PA-C    Allergies    Amoxicillin  Review of Systems   Review of Systems  Constitutional:  Negative for chills and fever.  HENT:  Negative for ear pain and sore throat.   Eyes:  Negative for pain and visual disturbance.  Respiratory:  Negative for cough and shortness of breath.   Cardiovascular:  Negative for chest pain and palpitations.  Gastrointestinal:  Positive for constipation, nausea and vomiting. Negative for abdominal pain.  Genitourinary:  Negative for dysuria and hematuria.  Musculoskeletal:  Negative for arthralgias and myalgias.  Skin:  Negative for color change and rash.  Neurological:  Negative for syncope and numbness.  All other systems reviewed and are negative.  Physical Exam Updated Vital Signs BP 123/65    Pulse 78    Temp 97.9 F (36.6 C) (Oral)    Resp 18    Ht 5\' 8"  (1.727 m)    Wt 74.8 kg    LMP 10/19/2021    SpO2 99%    BMI 25.09 kg/m   Physical Exam Constitutional:      General: She is not in acute distress. HENT:     Head: Normocephalic and atraumatic.  Eyes:  Conjunctiva/sclera: Conjunctivae normal.     Pupils: Pupils are equal, round, and reactive to light.  Cardiovascular:     Rate and Rhythm: Normal rate and regular rhythm.  Pulmonary:     Effort: Pulmonary effort is normal. No respiratory distress.  Abdominal:     General: There is no distension.     Tenderness: There is abdominal tenderness in the left lower quadrant. There is left CVA tenderness. There is no right CVA tenderness. Negative signs include Murphy's sign.  Skin:    General: Skin is warm and dry.  Neurological:     General: No focal deficit present.     Mental Status: She is alert. Mental status is at baseline.  Psychiatric:        Mood and Affect: Mood normal.        Behavior: Behavior normal.    ED Results / Procedures / Treatments   Labs (all labs ordered are listed, but only abnormal results are  displayed) Labs Reviewed  COMPREHENSIVE METABOLIC PANEL - Abnormal; Notable for the following components:      Result Value   Potassium 3.4 (*)    All other components within normal limits  CBC WITH DIFFERENTIAL/PLATELET - Abnormal; Notable for the following components:   WBC 16.5 (*)    Neutro Abs 12.6 (*)    Monocytes Absolute 1.4 (*)    All other components within normal limits  URINALYSIS, ROUTINE W REFLEX MICROSCOPIC - Abnormal; Notable for the following components:   Color, Urine STRAW (*)    Specific Gravity, Urine <1.005 (*)    All other components within normal limits  RESP PANEL BY RT-PCR (FLU A&B, COVID) ARPGX2  LIPASE, BLOOD  I-STAT BETA HCG BLOOD, ED ([facility], [facility], [facility] ONLY)  SURGICAL PATHOLOGY    EKG None  Radiology CT ABDOMEN PELVIS W CONTRAST  Result Date: 11/02/2021 CLINICAL DATA:  Left lower quadrant abdominal pain and costovertebral angle pain and tenderness. Vomiting. EXAM: CT ABDOMEN AND PELVIS WITH CONTRAST TECHNIQUE: Multidetector CT imaging of the abdomen and pelvis was performed using the standard protocol following bolus administration of intravenous contrast. CONTRAST:  157mL OMNIPAQUE IOHEXOL 300 MG/ML  SOLN COMPARISON:  None. FINDINGS: Lower chest: Normal Hepatobiliary: Normal. No liver parenchymal or gallbladder abnormality. Pancreas: Normal Spleen: Normal Adrenals/Urinary Tract: Adrenal glands are normal. Kidneys are normal. No cyst, mass, stone or hydronephrosis. Bladder is normal. Stomach/Bowel: Stomach and small intestine are normal. There is early acute appendicitis. Appendix measures up to 11 mm in diameter and is fluid-filled. The location of the appendix is just medial to the iliac vessels. There is a fairly large amount of stool and gas within the colon, particularly the right colon which could possibly be symptomatic. The left colon appears normal and unremarkable. Vascular/Lymphatic: Aorta and IVC are normal.  No adenopathy. Reproductive: Uterus and  adnexal regions are normal. There is a small amount of free fluid in the cul-de-sac. Other: No free air. Musculoskeletal: Normal IMPRESSION: Early acute appendicitis. Appendiceal diameter 11 mm, with internal fluid. Fairly large amount of fecal matter in the right colon. Small amount of free fluid in the pelvis. No evidence of urinary tract system or reproductive system abnormality. Electronically Signed   By: Nelson Chimes M.D.   On: 11/02/2021 14:11    Procedures Procedures   Medications Ordered in ED Medications  lactated ringers infusion ( Intravenous Continued from Pre-op 11/02/21 1643)  Chlorhexidine Gluconate Cloth 2 % PADS 6 each (has no administration in time range)  0.9 %  irrigation (POUR BTL) (1,000 mLs Irrigation Given 11/02/21 1700)  sodium chloride irrigation 0.9 % (1,000 mLs Irrigation Given 11/02/21 1701)  HYDROmorphone (DILAUDID) injection 1 mg (1 mg Intravenous Given 11/02/21 1322)  ketorolac (TORADOL) 30 MG/ML injection 30 mg (30 mg Intravenous Given 11/02/21 1322)  ondansetron (ZOFRAN) injection 4 mg (4 mg Intravenous Given 11/02/21 1321)  iohexol (OMNIPAQUE) 300 MG/ML solution 100 mL (100 mLs Intravenous Contrast Given 11/02/21 1352)  ciprofloxacin (CIPRO) IVPB 400 mg (400 mg Intravenous New Bag/Given 11/02/21 1552)  metroNIDAZOLE (FLAGYL) IVPB 500 mg (0 mg Intravenous Stopped 11/02/21 1546)  chlorhexidine (PERIDEX) 0.12 % solution 15 mL (15 mLs Mouth/Throat Given 11/02/21 1628)  acetaminophen (TYLENOL) tablet 1,000 mg (1,000 mg Oral Given 11/02/21 1632)    ED Course  I have reviewed the triage vital signs and the nursing notes.  Pertinent labs & imaging results that were available during my care of the patient were reviewed by me and considered in my medical decision making (see chart for details).  This patient complains of abdominal pain.  This involves an extensive number of treatment options, and is a complaint that carries with it a high risk of complications and  morbidity.  The differential diagnosis includes renal colic versus ureteral colic versus UTI versus appendicitis versus bowel obstruction versus symptomatic constipation versus other.  I ordered, reviewed, and interpreted labs.  White blood cell count elevated.  Labs otherwise unremarkable. I ordered medication IV pain medication IV antibiotics for abdominal pain and appendicitis. I ordered imaging studies which included CT abdomen pelvis I independently visualized and interpreted imaging which showed large amount of retained stool consistent with constipation as well as a dilated appendix consistent with acute appendicitis.   Additional history was obtained from patient's mother at bedside     Clinical Course as of 11/02/21 1731  Sat Nov 02, 2021  1417 Paged Dr Freida Busman regarding possible appendicitis.  IV antibiotics ordered with her leukocytosis.  Her pain is significantly improved on my reassessment.  Pt and her mother updated regarding CT results and plan for surgical consult [MT]  1423 Consult placed with Dr Freida Busman, who is currently operating [MT]    Clinical Course User Index [MT] Raidon Swanner, Kermit Balo, MD   Final Clinical Impression(s) / ED Diagnoses Final diagnoses:  Abdominal pain, unspecified abdominal location  Acute appendicitis with localized peritonitis, without perforation, abscess, or gangrene    Rx / DC Orders ED Discharge Orders     None        Terald Sleeper, MD 11/02/21 1731

## 2021-11-02 NOTE — Anesthesia Preprocedure Evaluation (Signed)
Anesthesia Evaluation  Patient identified by MRN, date of birth, ID band Patient awake    Reviewed: Allergy & Precautions, NPO status , Patient's Chart, lab work & pertinent test results  Airway Mallampati: II  TM Distance: >3 FB Neck ROM: Full    Dental no notable dental hx.    Pulmonary neg pulmonary ROS,    Pulmonary exam normal breath sounds clear to auscultation       Cardiovascular negative cardio ROS Normal cardiovascular exam Rhythm:Regular Rate:Normal     Neuro/Psych negative neurological ROS  negative psych ROS   GI/Hepatic negative GI ROS, Neg liver ROS,   Endo/Other  negative endocrine ROS  Renal/GU negative Renal ROS  negative genitourinary   Musculoskeletal negative musculoskeletal ROS (+)   Abdominal   Peds negative pediatric ROS (+)  Hematology negative hematology ROS (+)   Anesthesia Other Findings   Reproductive/Obstetrics negative OB ROS                             Anesthesia Physical Anesthesia Plan  ASA: 1  Anesthesia Plan: General   Post-op Pain Management:    Induction: Intravenous and Rapid sequence  PONV Risk Score and Plan: 3 and Ondansetron, Dexamethasone, Midazolam and Treatment may vary due to age or medical condition  Airway Management Planned: Oral ETT  Additional Equipment:   Intra-op Plan:   Post-operative Plan: Extubation in OR  Informed Consent: I have reviewed the patients History and Physical, chart, labs and discussed the procedure including the risks, benefits and alternatives for the proposed anesthesia with the patient or authorized representative who has indicated his/her understanding and acceptance.     Dental advisory given  Plan Discussed with: CRNA and Surgeon  Anesthesia Plan Comments:         Anesthesia Quick Evaluation

## 2021-11-02 NOTE — H&P (Signed)
Michelle Wright 05/25/2001  [id number].    Chief Complaint/Reason for Consult: abdominal pain, appendicitis  HPI:  Michelle Wright is a 20 yo previously health female who presented to the ED today with several hours of abdominal pain.  It has been associated with nausea and vomiting.  She endorses constipation.  She denies dysuria.  The pain began in the periumbilical area and moved to the left flank, and radiates across the lower abdomen and to the back.  It is improved after receiving pain medication in the ED.  She reports she previously had a similar episode of pain about a month ago that spontaneously resolved.  Labs are remarkable for leukocytosis with a WBC of 16.  Her UA was negative.  CT scan was consistent with early appendicitis.  General surgery was consulted.  The patient has not had any previous abdominal surgeries.  ROS: Review of Systems  Constitutional:  Negative for chills and fever.  Respiratory:  Negative for shortness of breath and wheezing.   Cardiovascular:  Negative for chest pain.  Gastrointestinal:  Positive for abdominal pain, constipation, nausea and vomiting.  Genitourinary:  Negative for dysuria and hematuria.  Neurological:  Negative for loss of consciousness and weakness.   Family History  Problem Relation Age of Onset   Healthy Mother    Healthy Father    Diabetes Maternal Grandfather    Heart disease Paternal Grandfather    Stroke Paternal Grandfather     History reviewed. No pertinent past medical history.  Past Surgical History:  Procedure Laterality Date   WISDOM TOOTH EXTRACTION      Social History:  reports that she has never smoked. She has never used smokeless tobacco. She reports that she does not drink alcohol and does not use drugs.  Allergies:  Allergies  Allergen Reactions   Amoxicillin Hives    (Not in a hospital admission)    Physical Exam: Blood pressure 107/74, pulse 82, temperature 98.2 F (36.8 C), resp. rate 20, height 5'  8" (1.727 m), weight 74.8 kg, last menstrual period 10/19/2021, SpO2 97 %. General: resting comfortably, appears stated age, no apparent distress Neurological: alert and oriented, no focal deficits, cranial nerves grossly in tact HEENT: normocephalic, atraumatic, oropharynx clear, no scleral icterus CV: regular rate and rhythm, extremities warm and well-perfused Respiratory: normal work of breathing on room air, symmetric chest wall expansion Abdomen: soft, nondistended, mildly tender to palpation in the LLQ and lower abdomen. No masses or organomegaly. Extremities: warm and well-perfused, no deformities, moving all extremities spontaneously Psychiatric: normal mood and affect Skin: warm and dry, no jaundice, no rashes or lesions   Results for orders placed or performed during the hospital encounter of 11/02/21 (from the past 48 hour(s))  Comprehensive metabolic panel     Status: Abnormal   Collection Time: 11/02/21 11:58 AM  Result Value Ref Range   Sodium 136 135 - 145 mmol/L   Potassium 3.4 (L) 3.5 - 5.1 mmol/L   Chloride 102 98 - 111 mmol/L   CO2 25 22 - 32 mmol/L   Glucose, Bld 98 70 - 99 mg/dL    Comment: Glucose reference range applies only to samples taken after fasting for at least 8 hours.   BUN 10 6 - 20 mg/dL   Creatinine, Ser 0.62 0.44 - 1.00 mg/dL   Calcium 9.3 8.9 - 10.3 mg/dL   Total Protein 7.2 6.5 - 8.1 g/dL   Albumin 4.4 3.5 - 5.0 g/dL   AST 25 15 - 41  U/L   ALT 23 0 - 44 U/L   Alkaline Phosphatase 63 38 - 126 U/L   Total Bilirubin 0.8 0.3 - 1.2 mg/dL   GFR, Estimated >16 >10 mL/min    Comment: (NOTE) Calculated using the CKD-EPI Creatinine Equation (2021)    Anion gap 9 5 - 15    Comment: Performed at [facility], [address] [place], [place] [address]  Lipase, blood     Status: None   Collection Time: 11/02/21 11:58 AM  Result Value Ref Range   Lipase 35 11 - 51 U/L    Comment: Performed at [facility], [address] [place],  [place] [address]  CBC with Diff     Status: Abnormal   Collection Time: 11/02/21 11:58 AM  Result Value Ref Range   WBC 16.5 (H) 4.0 - 10.5 K/uL   RBC 4.74 3.87 - 5.11 MIL/uL   Hemoglobin 14.5 12.0 - 15.0 g/dL   HCT 19.1 47.8 - 29.5 %   MCV 85.7 80.0 - 100.0 fL   MCH 30.6 26.0 - 34.0 pg   MCHC 35.7 30.0 - 36.0 g/dL   RDW 62.1 30.8 - 65.7 %   Platelets 232 150 - 400 K/uL   nRBC 0.0 0.0 - 0.2 %   Neutrophils Relative % 76 %   Neutro Abs 12.6 (H) 1.7 - 7.7 K/uL   Lymphocytes Relative 15 %   Lymphs Abs 2.4 0.7 - 4.0 K/uL   Monocytes Relative 8 %   Monocytes Absolute 1.4 (H) 0.1 - 1.0 K/uL   Eosinophils Relative 1 %   Eosinophils Absolute 0.1 0.0 - 0.5 K/uL   Basophils Relative 0 %   Basophils Absolute 0.0 0.0 - 0.1 K/uL   Immature Granulocytes 0 %   Abs Immature Granulocytes 0.06 0.00 - 0.07 K/uL    Comment: Performed at [facility], [address] [place], [place] [address]  I-Stat beta hCG blood, ED     Status: None   Collection Time: 11/02/21 12:13 PM  Result Value Ref Range   I-stat hCG, quantitative <5.0 <5 mIU/mL   Comment 3            Comment:   GEST. AGE      CONC.  (mIU/mL)   <=1 WEEK        5 - 50     2 WEEKS       50 - 500     3 WEEKS       100 - 10,000     4 WEEKS     1,000 - 30,000        FEMALE AND NON-PREGNANT FEMALE:     LESS THAN 5 mIU/mL   Resp Panel by RT-PCR (Flu A&B, Covid) Nasopharyngeal Swab     Status: None   Collection Time: 11/02/21  2:23 PM   Specimen: Nasopharyngeal Swab; Nasopharyngeal(NP) swabs in vial transport medium  Result Value Ref Range   SARS Coronavirus 2 by RT PCR NEGATIVE NEGATIVE    Comment: (NOTE) SARS-CoV-2 target nucleic acids are NOT DETECTED.  The SARS-CoV-2 RNA is generally detectable in upper respiratory specimens during the acute phase of infection. The lowest concentration of SARS-CoV-2 viral copies this assay can detect is 138 copies/mL. A negative result does not preclude SARS-Cov-2 infection and should not be used as  the sole basis for treatment or other patient management decisions. A negative result may occur with  improper specimen collection/handling, submission of  specimen other than nasopharyngeal swab, presence of viral mutation(s) within the areas targeted by this assay, and inadequate number of viral copies(<138 copies/mL). A negative result must be combined with clinical observations, patient history, and epidemiological information. The expected result is Negative.  Fact Sheet for Patients:  BloggerCourse.comhttps://www.fda.gov/media/152166/download  Fact Sheet for Healthcare Providers:  SeriousBroker.ithttps://www.fda.gov/media/152162/download  This test is no t yet approved or cleared by the [place] FDA and  has been authorized for detection and/or diagnosis of SARS-CoV-2 by FDA under an Emergency Use Authorization (EUA). This EUA will remain  in effect (meaning this test can be used) for the duration of the COVID-19 declaration under Section 564(b)(1) of the Act, 21 U.S.C.section 360bbb-3(b)(1), unless the authorization is terminated  or revoked sooner.       Influenza A by PCR NEGATIVE NEGATIVE   Influenza B by PCR NEGATIVE NEGATIVE    Comment: (NOTE) The Xpert Xpress SARS-CoV-2/FLU/RSV plus assay is intended as an aid in the diagnosis of influenza from Nasopharyngeal swab specimens and should not be used as a sole basis for treatment. Nasal washings and aspirates are unacceptable for Xpert Xpress SARS-CoV-2/FLU/RSV testing.  Fact Sheet for Patients: BloggerCourse.comhttps://www.fda.gov/media/152166/download  Fact Sheet for Healthcare Providers: SeriousBroker.ithttps://www.fda.gov/media/152162/download  This test is not yet approved or cleared by the [place] FDA and has been authorized for detection and/or diagnosis of SARS-CoV-2 by FDA under an Emergency Use Authorization (EUA). This EUA will remain in effect (meaning this test can be used) for the duration of the COVID-19 declaration under Section 564(b)(1) of the Act, 21  U.S.C. section 360bbb-3(b)(1), unless the authorization is terminated or revoked.  Performed at [facility], [address] [place], [place] [address]   Urinalysis, Routine w reflex microscopic Urine, Clean Catch     Status: Abnormal   Collection Time: 11/02/21  3:10 PM  Result Value Ref Range   Color, Urine STRAW (A) YELLOW   APPearance CLEAR CLEAR   Specific Gravity, Urine <1.005 (L) 1.005 - 1.030   pH 7.0 5.0 - 8.0   Glucose, UA NEGATIVE NEGATIVE mg/dL   Hgb urine dipstick NEGATIVE NEGATIVE   Bilirubin Urine NEGATIVE NEGATIVE   Ketones, ur NEGATIVE NEGATIVE mg/dL   Protein, ur NEGATIVE NEGATIVE mg/dL   Nitrite NEGATIVE NEGATIVE   Leukocytes,Ua NEGATIVE NEGATIVE    Comment: Microscopic not done on urines with negative protein, blood, leukocytes, nitrite, or glucose < 500 mg/dL. Performed at [facility], [address] [place], [place] [address]    CT ABDOMEN PELVIS W CONTRAST  Result Date: 11/02/2021 CLINICAL DATA:  Left lower quadrant abdominal pain and costovertebral angle pain and tenderness. Vomiting. EXAM: CT ABDOMEN AND PELVIS WITH CONTRAST TECHNIQUE: Multidetector CT imaging of the abdomen and pelvis was performed using the standard protocol following bolus administration of intravenous contrast. CONTRAST:  100mL OMNIPAQUE IOHEXOL 300 MG/ML  SOLN COMPARISON:  None. FINDINGS: Lower chest: Normal Hepatobiliary: Normal. No liver parenchymal or gallbladder abnormality. Pancreas: Normal Spleen: Normal Adrenals/Urinary Tract: Adrenal glands are normal. Kidneys are normal. No cyst, mass, stone or hydronephrosis. Bladder is normal. Stomach/Bowel: Stomach and small intestine are normal. There is early acute appendicitis. Appendix measures up to 11 mm in diameter and is fluid-filled. The location of the appendix is just medial to the iliac vessels. There is a fairly large amount of stool and gas within the colon, particularly the right colon which could possibly be  symptomatic. The left colon appears normal and unremarkable. Vascular/Lymphatic: Aorta and IVC are normal.  No adenopathy. Reproductive: Uterus and adnexal regions are normal. There is a small amount of free fluid in the cul-de-sac. Other: No free air. Musculoskeletal: Normal IMPRESSION: Early acute appendicitis. Appendiceal diameter 11 mm, with internal fluid. Fairly large amount of fecal matter in the right colon. Small amount of free fluid in the pelvis. No evidence of urinary tract system or reproductive system abnormality. Electronically Signed   By: Nelson Chimes M.D.   On: 11/02/2021 14:11      Assessment/Plan 20 yo female presenting with less than 24 hours of acute-onset abdominal pain, nausea and vomiting, with leukocytosis. I personally reviewed her CT scan, which shows a very mildly dilated fluid-filled appendix. There is no free fluid or stranding. This appears to be very early appendicitis, although the localization of her pain to the left side is unusual. UA was negative and she does not have any urinary symptoms to suggest cystitis or pyelonephritis. Given her pain, leukocytosis and imaging findings, I recommended laparoscopic appendectomy.  I discussed the details of this procedure with the patient and her parents, and she agrees to proceed with surgery.  We will proceed to the OR this evening with plans to discharge home from PACU.  Post-operative instructions were discussed.   Michaelle Birks, [facility], Hepatobiliary and Pancreatic Surgery 11/02/21 4:13 PM

## 2021-11-02 NOTE — ED Provider Notes (Signed)
Emergency Medicine Provider Triage Evaluation Note  Sheniqua Carolan , a 20 y.o. female  was evaluated in triage.  Pt complains of L flank pain. She states that same awoke her from sleep this morning with associated nausea and vomiting. She thought she might be constipated, however states she took Miralax and had a full bowel movement which did not relieve her symptoms. States pain is worsening and waxes and wanes. Sharp in nature in the L flank and side. No hx of abdominal surgeries or kidney stones. No fevers, chills, dysuria, hematuria, vaginal discharge  Review of Systems  Positive:  Negative: See above  Physical Exam  BP (!) 101/91 (BP Location: Right Arm)    Pulse 77    Temp 97.6 F (36.4 C) (Oral)    Resp 16    LMP 10/19/2021    SpO2 97%  Gen:   Awake, some distress, clearly uncomfortable resting in chair Resp:  Normal effort  MSK:   Moves extremities without difficulty  Other:  L sided CVA tenderness  Medical Decision Making  Medically screening exam initiated at 11:56 AM.  Appropriate orders placed.  Tiffanni Scarfo was informed that the remainder of the evaluation will be completed by another provider, this initial triage assessment does not replace that evaluation, and the importance of remaining in the ED until their evaluation is complete.     Vear Clock 11/02/21 1158    Linwood Dibbles, MD 11/03/21 [phone]

## 2021-11-02 NOTE — ED Triage Notes (Signed)
C/o L lateral abd pain and L flank pain with nausea and vomiting since 7am.  Denies urinary complaints.  States she thought she was constipated.  Took Miralax and had a bowel movement and briefly felt better but then pain worsened.

## 2021-11-02 NOTE — Anesthesia Procedure Notes (Signed)
Procedure Name: Intubation Date/Time: 11/02/2021 4:49 PM Performed by: Griffin Dakin, CRNA Pre-anesthesia Checklist: Patient identified, Emergency Drugs available, Suction available and Patient being monitored Patient Re-evaluated:Patient Re-evaluated prior to induction Oxygen Delivery Method: Circle system utilized Preoxygenation: Pre-oxygenation with 100% oxygen Induction Type: IV induction and Rapid sequence Laryngoscope Size: Mac and 4 Grade View: Grade I Tube type: Oral Tube size: 7.0 mm Number of attempts: 1 Airway Equipment and Method: Stylet Placement Confirmation: ETT inserted through vocal cords under direct vision, positive ETCO2 and breath sounds checked- equal and bilateral Secured at: 22 cm Tube secured with: Tape Dental Injury: Teeth and Oropharynx as per pre-operative assessment

## 2021-11-02 NOTE — Op Note (Signed)
Date: 11/02/21  Patient: Michelle Wright MRN: [id number]  Preoperative Diagnosis: Acute appendicitis Postoperative Diagnosis: Acute appendicitis without perforation  Procedure: Laparoscopic appendectomy  Surgeon: Sophronia Simas, MD  EBL: Minimal  Anesthesia: General endotracheal  Specimens: Appendix  Indications: [name] is a 20 year old female who presented to the ED with several hours of abdominal pain and leukocytosis.  CT scan was consistent with early acute appendicitis.  After a discussion of the risks and benefits of surgery, she agreed to proceed with appendectomy.  Findings: Dilated and erythematous appendix consistent with early appendicitis.  No perforation or abscess.  Procedure details: Informed consent was obtained in the preoperative area prior to the procedure. The patient was brought to the operating room and placed on the table in the supine position.  General anesthesia was induced and appropriate lines and drains were placed for intraoperative monitoring. Perioperative antibiotics were administered per SCIP guidelines. The abdomen was prepped and draped in the usual sterile fashion. A pre-procedure timeout was taken verifying patient identity, surgical site and procedure to be performed.  A small infraumbilical skin incision was made and the subcutaneous tissue was spread to expose the fascia. The umbilical stalk was grasped and elevated, and the fascia was sharply incised. The peritoneal cavity was visualized and a 71mm Hasson trocar was inserted. The abdomen was inspected with no evidence of visceral or vascular injury. A suprapubic 79mm port was placed, followed by a 47mm port in the LLQ, both under direct visualization. The cecum was identified and elevated, and the appendix was visualized. The appendix was erythematous and dilated, consistent with acute appendicitis. The retroperitoneal attachments were taken down with Harmonic shears.  The terminal ileum was mildly  adherent to the base of the appendix, and these adhesions were taken down sharply.  A mesenteric window was bluntly created at the base of the appendix, and the appendix was divided at the base from the cecum using a 65mm stapler with a blue load. The mesoappendix was then divided with the harmonic. The specimen was placed in an endocatch bag and removed and sent for routine pathology. The surgical site was irrigated. The staple line was inspected and appeared in tact with no bleeding or leakage. The mesoappendix was hemostatic. The ports were removed and the pneumoperitoneum was evacuated. The umbilical port site fascia was closed with an 0 Vicryl suture. The skin at all port sites was closed with 4-0 monocryl subcuticular suture. Dermabond was applied.  The patient tolerated the procedure well with no apparent complications.  All counts were correct x2 at the end of the procedure. The patient was extubated and taken to PACU in stable condition.  Sophronia Simas, MD 11/02/21 6:08 PM

## 2021-11-03 ENCOUNTER — Encounter (HOSPITAL_COMMUNITY): Payer: Self-pay | Admitting: Surgery

## 2021-11-03 NOTE — Anesthesia Postprocedure Evaluation (Signed)
Anesthesia Post Note  Patient: Michelle Wright  Procedure(s) Performed: APPENDECTOMY LAPAROSCOPIC     Patient location during evaluation: PACU Anesthesia Type: General Level of consciousness: awake and alert Pain management: pain level controlled Vital Signs Assessment: post-procedure vital signs reviewed and stable Respiratory status: spontaneous breathing, nonlabored ventilation, respiratory function stable and patient connected to nasal cannula oxygen Cardiovascular status: blood pressure returned to baseline and stable Postop Assessment: no apparent nausea or vomiting Anesthetic complications: no   No notable events documented.  Last Vitals:  Vitals:   11/02/21 1840 11/02/21 1845  BP:  90/70  Pulse: 89 86  Resp: 14 13  Temp:  36.8 C  SpO2: 98% 99%    Last Pain:  Vitals:   11/02/21 1845  TempSrc:   PainSc: 2                  Tavi Gaughran,Eberlin S

## 2021-11-05 LAB — SURGICAL PATHOLOGY

## 2022-02-12 DIAGNOSIS — R531 Weakness: Secondary | ICD-10-CM | POA: Diagnosis not present

## 2022-02-12 DIAGNOSIS — R0789 Other chest pain: Secondary | ICD-10-CM | POA: Diagnosis not present

## 2022-02-12 DIAGNOSIS — R0602 Shortness of breath: Secondary | ICD-10-CM | POA: Diagnosis not present

## 2022-02-12 DIAGNOSIS — R410 Disorientation, unspecified: Secondary | ICD-10-CM | POA: Diagnosis not present

## 2022-02-12 DIAGNOSIS — Z20822 Contact with and (suspected) exposure to covid-19: Secondary | ICD-10-CM | POA: Diagnosis not present

## 2022-02-12 DIAGNOSIS — R5383 Other fatigue: Secondary | ICD-10-CM | POA: Diagnosis not present

## 2022-02-12 DIAGNOSIS — R251 Tremor, unspecified: Secondary | ICD-10-CM | POA: Diagnosis not present

## 2022-02-12 DIAGNOSIS — F419 Anxiety disorder, unspecified: Secondary | ICD-10-CM | POA: Diagnosis not present

## 2022-02-12 DIAGNOSIS — R42 Dizziness and giddiness: Secondary | ICD-10-CM | POA: Diagnosis not present

## 2022-02-12 DIAGNOSIS — R55 Syncope and collapse: Secondary | ICD-10-CM | POA: Diagnosis not present

## 2022-06-01 DIAGNOSIS — R059 Cough, unspecified: Secondary | ICD-10-CM | POA: Diagnosis not present

## 2022-06-01 DIAGNOSIS — J069 Acute upper respiratory infection, unspecified: Secondary | ICD-10-CM | POA: Diagnosis not present

## 2022-07-04 DIAGNOSIS — Z880 Allergy status to penicillin: Secondary | ICD-10-CM | POA: Diagnosis not present

## 2022-07-04 DIAGNOSIS — Z833 Family history of diabetes mellitus: Secondary | ICD-10-CM | POA: Diagnosis not present

## 2022-07-04 DIAGNOSIS — Z820 Family history of epilepsy and other diseases of the nervous system: Secondary | ICD-10-CM | POA: Diagnosis not present

## 2022-07-04 DIAGNOSIS — I639 Cerebral infarction, unspecified: Secondary | ICD-10-CM | POA: Diagnosis not present

## 2022-07-04 DIAGNOSIS — R29898 Other symptoms and signs involving the musculoskeletal system: Secondary | ICD-10-CM | POA: Diagnosis not present

## 2022-07-04 DIAGNOSIS — R200 Anesthesia of skin: Secondary | ICD-10-CM | POA: Diagnosis not present

## 2022-07-04 DIAGNOSIS — Z8249 Family history of ischemic heart disease and other diseases of the circulatory system: Secondary | ICD-10-CM | POA: Diagnosis not present

## 2022-07-04 DIAGNOSIS — R202 Paresthesia of skin: Secondary | ICD-10-CM | POA: Diagnosis not present

## 2022-07-04 DIAGNOSIS — J3489 Other specified disorders of nose and nasal sinuses: Secondary | ICD-10-CM | POA: Diagnosis not present

## 2022-07-04 DIAGNOSIS — E860 Dehydration: Secondary | ICD-10-CM | POA: Diagnosis not present

## 2022-07-05 DIAGNOSIS — R202 Paresthesia of skin: Secondary | ICD-10-CM | POA: Diagnosis not present

## 2022-07-05 DIAGNOSIS — R930 Abnormal findings on diagnostic imaging of skull and head, not elsewhere classified: Secondary | ICD-10-CM | POA: Diagnosis not present

## 2022-07-05 DIAGNOSIS — E860 Dehydration: Secondary | ICD-10-CM | POA: Diagnosis not present

## 2022-07-05 DIAGNOSIS — I639 Cerebral infarction, unspecified: Secondary | ICD-10-CM | POA: Diagnosis not present

## 2022-07-05 DIAGNOSIS — R29898 Other symptoms and signs involving the musculoskeletal system: Secondary | ICD-10-CM | POA: Diagnosis not present

## 2022-10-27 DIAGNOSIS — Z0000 Encounter for general adult medical examination without abnormal findings: Secondary | ICD-10-CM | POA: Diagnosis not present

## 2022-11-17 DIAGNOSIS — Z01419 Encounter for gynecological examination (general) (routine) without abnormal findings: Secondary | ICD-10-CM | POA: Diagnosis not present

## 2022-11-17 DIAGNOSIS — Z1151 Encounter for screening for human papillomavirus (HPV): Secondary | ICD-10-CM | POA: Diagnosis not present

## 2023-05-17 IMAGING — CT CT ABD-PELV W/ CM
2 of 4 series · 16 of 46 positions shown, 18 images · IV contrast (APPLIED)
Comparison: None.

CLINICAL DATA: Left lower quadrant abdominal pain and
costovertebral angle pain and tenderness. Vomiting.

EXAM:
CT ABDOMEN AND PELVIS WITH CONTRAST
TECHNIQUE: Multidetector CT imaging of the abdomen and pelvis was performed
using the standard protocol following bolus administration of
intravenous contrast.
CONTRAST:  100mL OMNIPAQUE IOHEXOL 300 MG/ML  SOLN

[Series 3: abdomen 5.0 · axial · 0.61mm/px · z∈[-432,-12]mm · 13 of 96 slices shown, 15 images]
[im 6/96  soft-tissue]
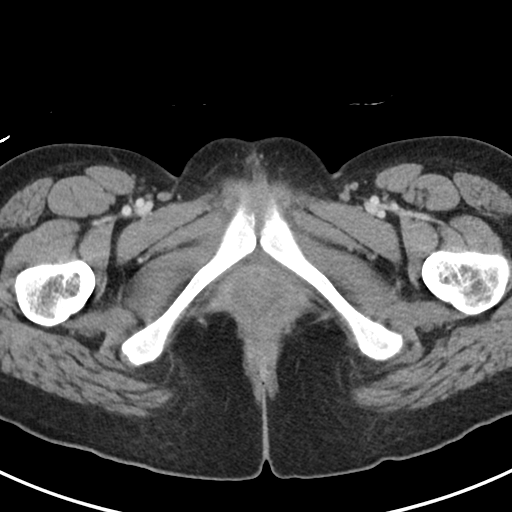
[im 6/96  bone]
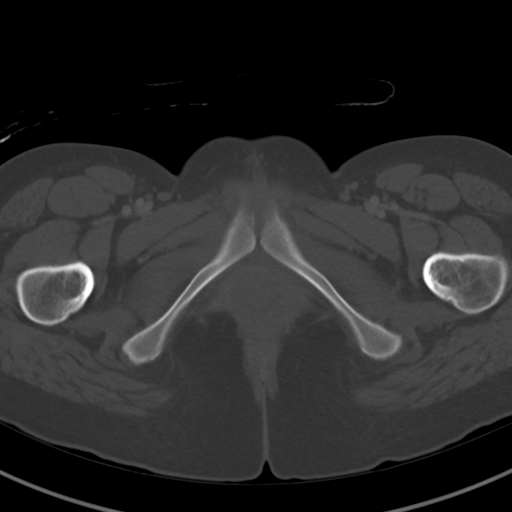
[im 12/96  soft-tissue]
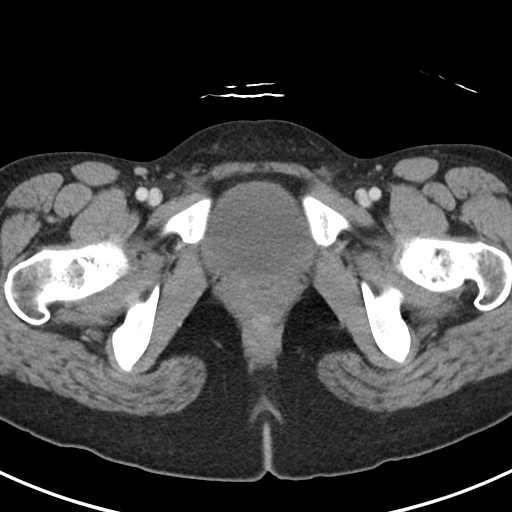
[im 23/96  soft-tissue]
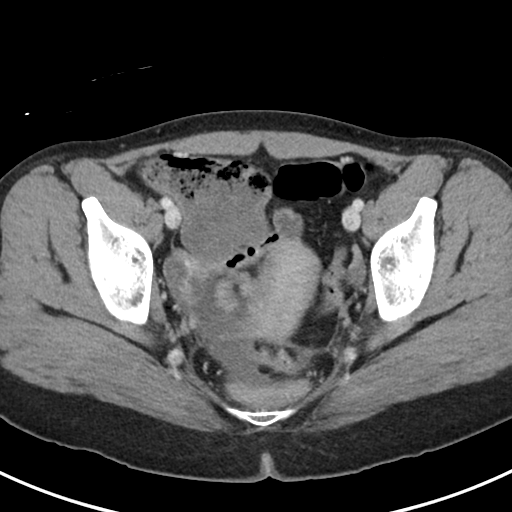
[im 28/96  soft-tissue]
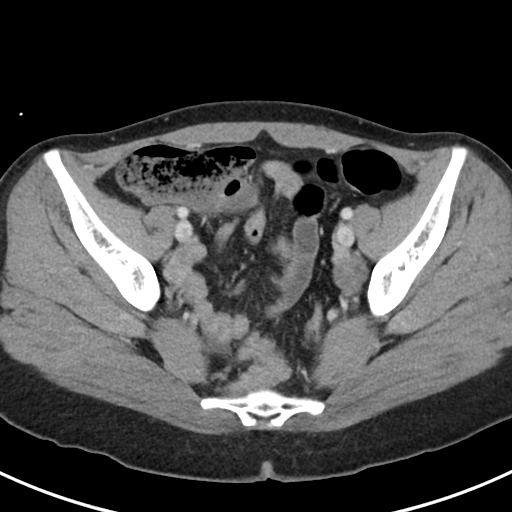
[im 34/96  soft-tissue]
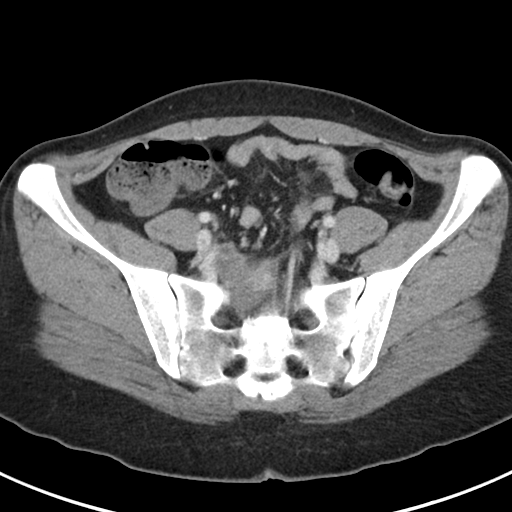
[im 40/96  soft-tissue]
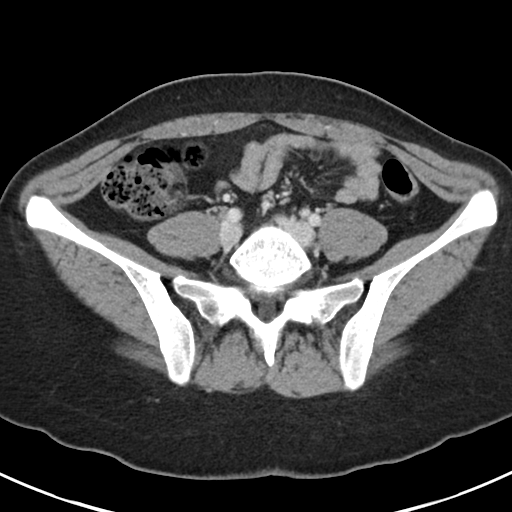
[im 51/96  soft-tissue]
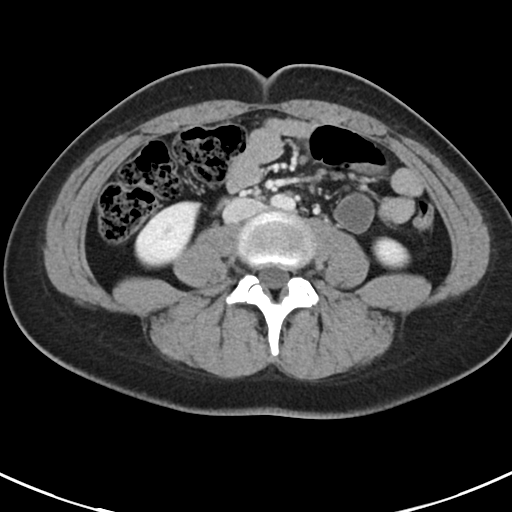
[im 56/96  soft-tissue]
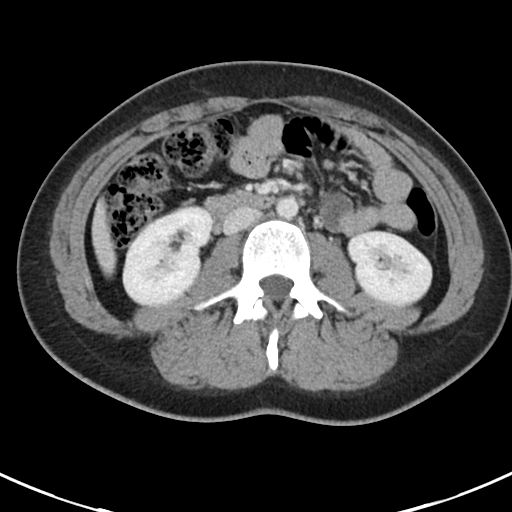
[im 62/96  soft-tissue]
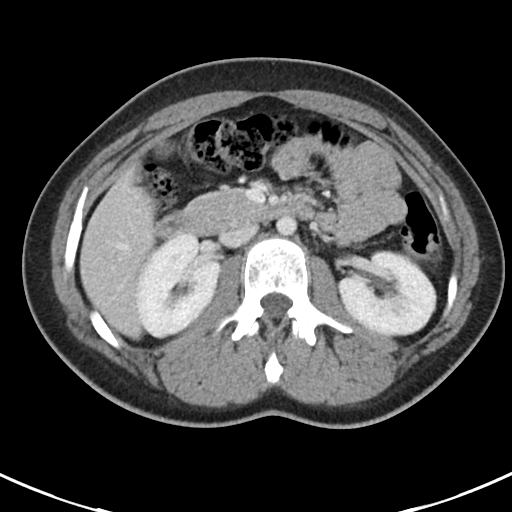
[im 62/96  bone]
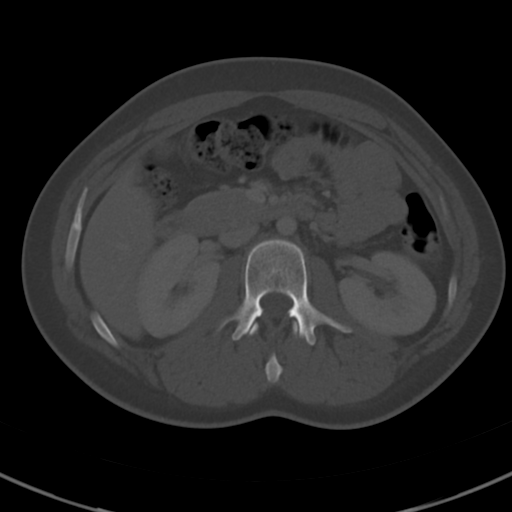
[im 68/96  soft-tissue]
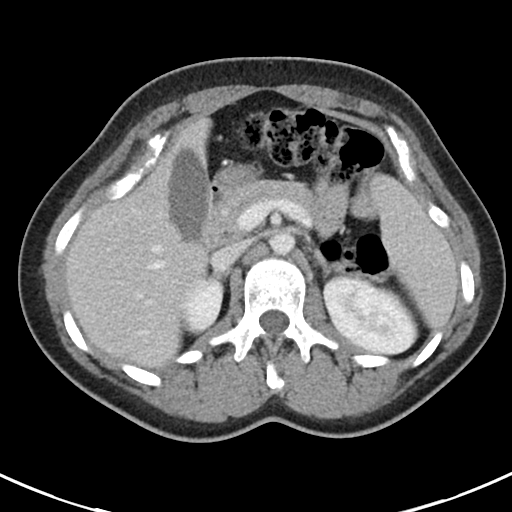
[im 73/96  soft-tissue]
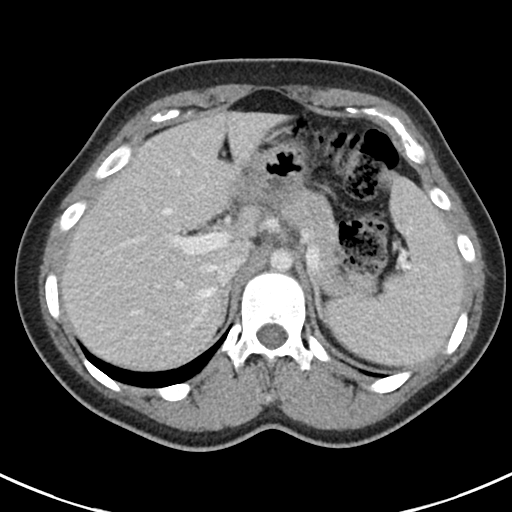
[im 84/96  soft-tissue]
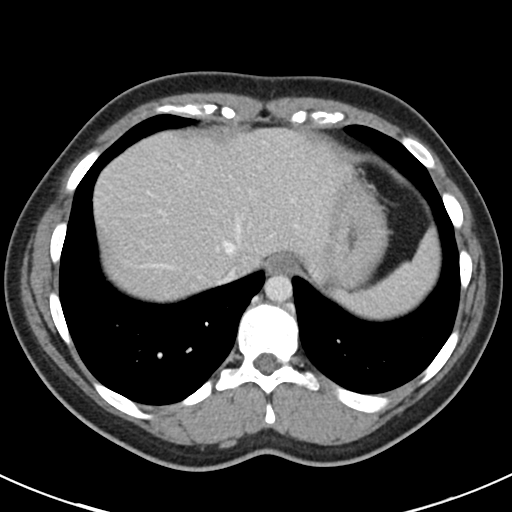
[im 90/96  soft-tissue]
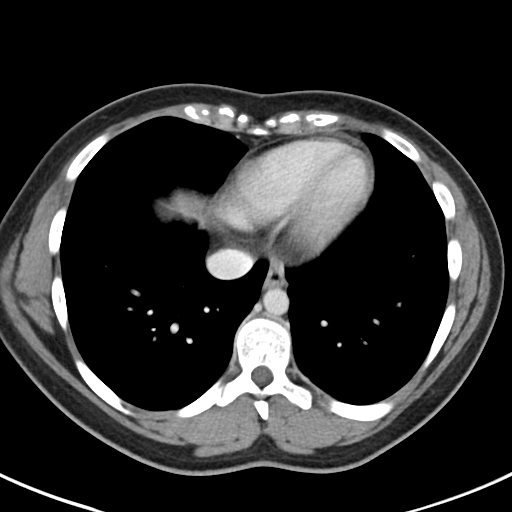

[Series 6: abdomen 3.0 mpr cor · coronal · 0.67mm/px · 3 of 84 slices shown]
[im 28/84  soft-tissue]
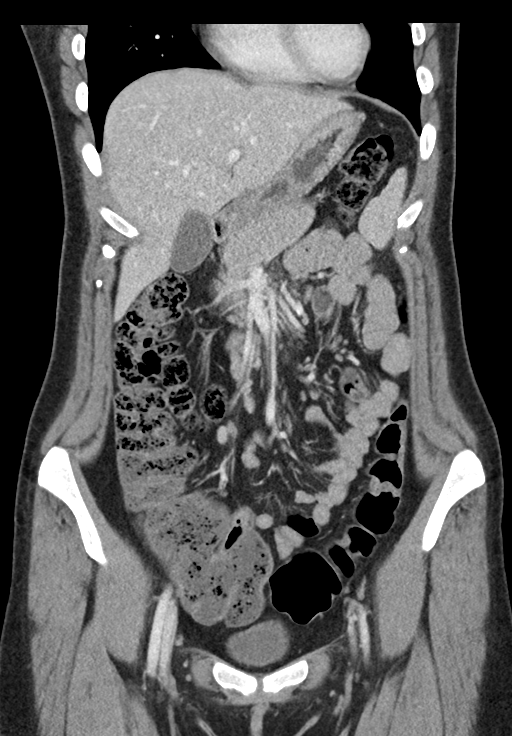
[im 37/84  soft-tissue]
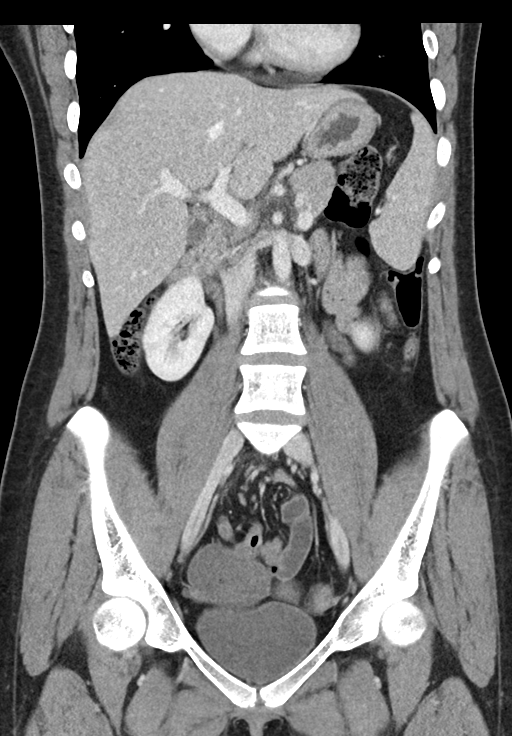
[im 47/84  soft-tissue]
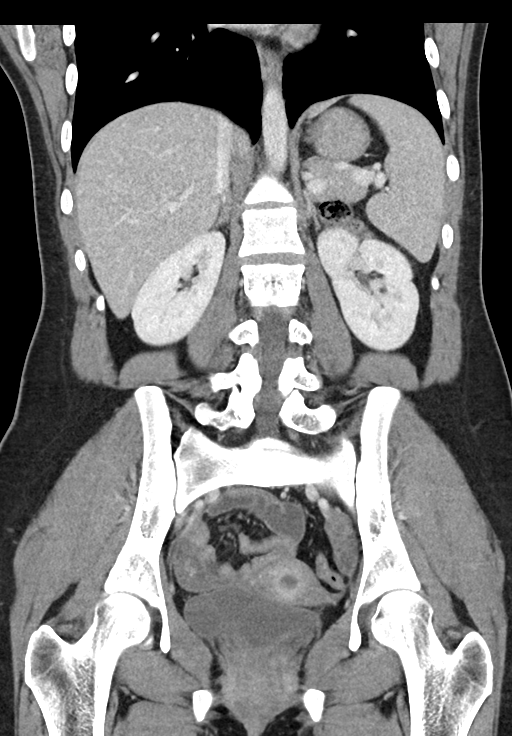

[16 of 46 positions shown; findings below may reference images not displayed]

FINDINGS: Lower chest: Normal

Hepatobiliary: Normal. No liver parenchymal or gallbladder
abnormality.

Pancreas: Normal

Spleen: Normal

Adrenals/Urinary Tract: Adrenal glands are normal. Kidneys are
normal. No cyst, mass, stone or hydronephrosis. Bladder is normal.

Stomach/Bowel: Stomach and small intestine are normal. There is
early acute appendicitis. Appendix measures up to 11 mm in diameter
and is fluid-filled. The location of the appendix is just medial to
the iliac vessels. There is a fairly large amount of stool and gas
within the colon, particularly the right colon which could possibly
be symptomatic. The left colon appears normal and unremarkable.

Vascular/Lymphatic: Aorta and IVC are normal.  No adenopathy.

Reproductive: Uterus and adnexal regions are normal. There is a
small amount of free fluid in the cul-de-sac.

Other: No free air.

Musculoskeletal: Normal
IMPRESSION: Early acute appendicitis. Appendiceal diameter 11 mm, with internal
fluid.

Fairly large amount of fecal matter in the right colon.

Small amount of free fluid in the pelvis.

No evidence of urinary tract system or reproductive system
abnormality.

## 2024-08-08 DIAGNOSIS — F4322 Adjustment disorder with anxiety: Secondary | ICD-10-CM | POA: Diagnosis not present

## 2024-08-24 DIAGNOSIS — F4322 Adjustment disorder with anxiety: Secondary | ICD-10-CM | POA: Diagnosis not present

## 2024-09-08 DIAGNOSIS — F4322 Adjustment disorder with anxiety: Secondary | ICD-10-CM | POA: Diagnosis not present

## 2024-09-22 DIAGNOSIS — F4322 Adjustment disorder with anxiety: Secondary | ICD-10-CM | POA: Diagnosis not present

## 2024-10-14 DIAGNOSIS — F4322 Adjustment disorder with anxiety: Secondary | ICD-10-CM | POA: Diagnosis not present
